# Patient Record
Sex: Male | Born: 2000 | Race: White | Hispanic: No | Marital: Single | Smoking: Never smoker
Health system: Southern US, Community
[De-identification: ages and names within clinical notes are randomized; demographics above are authoritative.]

## PROBLEM LIST (undated history)

## (undated) DIAGNOSIS — J302 Other seasonal allergic rhinitis: Secondary | ICD-10-CM

## (undated) DIAGNOSIS — Z87898 Personal history of other specified conditions: Secondary | ICD-10-CM

## (undated) DIAGNOSIS — R519 Headache, unspecified: Secondary | ICD-10-CM

## (undated) DIAGNOSIS — Z973 Presence of spectacles and contact lenses: Secondary | ICD-10-CM

## (undated) DIAGNOSIS — R51 Headache: Secondary | ICD-10-CM

---
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
## 2019-12-23 ENCOUNTER — Other Ambulatory Visit: Payer: Self-pay

## 2019-12-23 ENCOUNTER — Emergency Department: Payer: BC Managed Care – PPO

## 2019-12-23 DIAGNOSIS — R0789 Other chest pain: Secondary | ICD-10-CM | POA: Diagnosis present

## 2019-12-23 DIAGNOSIS — Z79899 Other long term (current) drug therapy: Secondary | ICD-10-CM | POA: Diagnosis not present

## 2019-12-23 DIAGNOSIS — R1012 Left upper quadrant pain: Secondary | ICD-10-CM | POA: Diagnosis not present

## 2019-12-23 LAB — BASIC METABOLIC PANEL
Anion gap: 7 (ref 5–15)
BUN: 9 mg/dL (ref 6–20)
CO2: 28 mmol/L (ref 22–32)
Calcium: 10.1 mg/dL (ref 8.9–10.3)
Chloride: 106 mmol/L (ref 98–111)
Creatinine, Ser: 1 mg/dL (ref 0.61–1.24)
GFR calc Af Amer: >60 mL/min (ref >60)
GFR calc non Af Amer: >60 mL/min (ref >60)
Glucose, Bld: 102 mg/dL — ABNORMAL HIGH (ref 70–99)
Potassium: 4.4 mmol/L (ref 3.5–5.1)
Sodium: 141 mmol/L (ref 135–145)

## 2019-12-23 LAB — TROPONIN I (HIGH SENSITIVITY): Troponin I (High Sensitivity): 2 ng/L (ref <18)

## 2019-12-23 LAB — CBC
HCT: 43.2 % (ref 39.0–52.0)
Hemoglobin: 15.3 g/dL (ref 13.0–17.0)
MCH: 31 pg (ref 26.0–34.0)
MCHC: 35.4 g/dL (ref 30.0–36.0)
MCV: 87.6 fL (ref 80.0–100.0)
Platelets: 268 10*3/uL (ref 150–400)
RBC: 4.93 MIL/uL (ref 4.22–5.81)
RDW: 11.9 % (ref 11.5–15.5)
WBC: 6.5 10*3/uL (ref 4.0–10.5)
nRBC: 0 % (ref 0.0–0.2)

## 2019-12-23 LAB — LIPASE, BLOOD: Lipase: 22 U/L (ref 11–51)

## 2019-12-23 NOTE — ED Notes (Signed)
Pt walking around lobby in no acute distress.

## 2019-12-23 NOTE — ED Triage Notes (Signed)
Pt complains of left sided chest and upper abd pain for several months worsening over last 2 days. Pt appears in no acute distress. Pt denies vomiting, diarrhea, shob.

## 2019-12-23 NOTE — ED Notes (Signed)
Pt updated on wait.  °

## 2019-12-24 ENCOUNTER — Emergency Department
Admission: EM | Admit: 2019-12-24 | Discharge: 2019-12-24 | Disposition: A | Discharge status: Home / Self Care | Payer: BC Managed Care – PPO | Attending: Emergency Medicine | Admitting: Emergency Medicine

## 2019-12-24 DIAGNOSIS — R0789 Other chest pain: Secondary | ICD-10-CM

## 2019-12-24 DIAGNOSIS — R109 Unspecified abdominal pain: Secondary | ICD-10-CM

## 2019-12-24 NOTE — ED Notes (Signed)
Reviewed discharge instructions, follow-up care, and prescriptions with patient. Patient verbalized understanding of all information reviewed. Patient stable, with no distress noted at this time.    

## 2019-12-24 NOTE — ED Provider Notes (Signed)
[facility] Emergency Department Provider Note ____________________________________________   First MD Initiated Contact with Patient 12/24/19 [phone]     (approximate)  I have reviewed the triage vital signs and the nursing notes.   HISTORY  Chief Complaint Abdominal Pain and Chest Pain    HPI Adrian Montes is a 19 y.o. male with PMH as noted below who presents with episodes of abdominal and chest pain which have been going on for approximately 6 months.  He states that they happen every few days and usually last for about 30 minutes.  The pain is in the left and mid upper abdomen and the left side of the chest.  It is sometimes related to eating, but other times happens without anything preceding it.  He denies any nausea or vomiting and has no shortness of breath.  He has taken famotidine for a once today without any relief.  The patient states that today's episode lasted for about 2 hours which is the reason he came to be evaluated.  Past Medical History:  Diagnosis Date  . H/O motion sickness   . Headache    in past  . Seasonal allergies   . Wears contact lenses     There are no problems to display for this patient.   Past Surgical History:  Procedure Laterality Date  . TEAR DUCT PROBING  2003  . TONSILLECTOMY AND ADENOIDECTOMY N/A 03/06/2015   Procedure: TONSILLECTOMY AND ADENOIDECTOMY;  Surgeon: Carloyn Manner, MD;  Location: [facility];  Service: ENT;  Laterality: N/A;    Prior to Admission medications   Medication Sig Start Date End Date Taking? Authorizing Provider  loratadine (CLARITIN) 10 MG tablet Take 10 mg by mouth daily. AM    [provider]  montelukast (SINGULAIR) 10 MG tablet Take 10 mg by mouth daily. AM    [provider]  Multiple Vitamin (MULTIVITAMIN) capsule Take 1 capsule by mouth daily. AM    [provider]    Allergies Patient has no known allergies.  No family history on  file.  Social History Social History   Tobacco Use  . Smoking status: Never Smoker  Substance Use Topics  . Alcohol use: No  . Drug use: Not on file    Review of Systems  Constitutional: No fever. Eyes: No redness. ENT: No sore throat. Cardiovascular: Positive for resolved chest pain. Respiratory: Denies shortness of breath. Gastrointestinal: No vomiting or diarrhea.  Genitourinary: Negative for flank pain.  Musculoskeletal: Negative for back pain. Skin: Negative for rash. Neurological: Negative for headache.   ____________________________________________   PHYSICAL EXAM:  VITAL SIGNS: ED Triage Vitals [12/23/19 2010]  Enc Vitals Group     BP (!) 152/79     Pulse Rate 79     Resp 14     Temp 98.4 F (36.9 C)     Temp Source Oral     SpO2 100 %     Weight 185 lb (83.9 kg)     Height 6' (1.829 m)     Head Circumference      Peak Flow      Pain Score 5     Pain Loc      Pain Edu?      Excl. in [place]?     Constitutional: Alert and oriented. Well appearing and in no acute distress. Eyes: Conjunctivae are normal.  Head: Atraumatic. Nose: No congestion/rhinnorhea. Mouth/Throat: Mucous membranes are moist.   Neck: Normal range of motion.  Cardiovascular: Normal rate, regular rhythm. Grossly normal heart sounds.  Good peripheral circulation. Respiratory: Normal respiratory effort.  No retractions. Lungs CTAB. Gastrointestinal: Soft and nontender. No distention.  Genitourinary: No flank tenderness. Musculoskeletal: Extremities warm and well perfused.  Neurologic:  Normal speech and language. No gross focal neurologic deficits are appreciated.  Skin:  Skin is warm and dry. No rash noted. Psychiatric: Mood and affect are normal. Speech and behavior are normal.  ____________________________________________   LABS (all labs ordered are listed, but only abnormal results are displayed)  Labs Reviewed  BASIC METABOLIC PANEL - Abnormal; Notable for the following  components:      Result Value   Glucose, Bld 102 (*)    All other components within normal limits  CBC  LIPASE, BLOOD  TROPONIN I (HIGH SENSITIVITY)   ____________________________________________  EKG  ED ECG REPORT I, Dionne Bucy, the attending physician, personally viewed and interpreted this ECG.  Date: 12/24/2019 EKG Time: 2012 Rate: 69 Rhythm: normal sinus rhythm QRS Axis: normal Intervals: normal ST/T Wave abnormalities: normal Narrative Interpretation: no evidence of acute ischemia  ____________________________________________  RADIOLOGY  CXR: No focal infiltrate or other acute abnormality  ____________________________________________   PROCEDURES  Procedure(s) performed: No  Procedures  Critical Care performed: No ____________________________________________   INITIAL IMPRESSION / ASSESSMENT AND PLAN / ED COURSE  Pertinent labs & imaging results that were available during my care of the patient were reviewed by me and considered in my medical decision making (see chart for details).  19 year old male with PMH as noted above presents with intermittent episodes of mainly left-sided chest and abdominal pain for the last 6 months.  The episode today lasted somewhat longer which is what prompted him to come to the hospital.  He has not tried anything for it except for taking famotidine once.  On exam, the patient is overall well-appearing.  His vital signs are normal.  The physical exam is unremarkable.  EKG was reviewed and is nonischemic.  I reviewed the past medical records in Epic.  The patient has no prior ED visits or admissions here.  Given the intermittent episodic nature of the pain and its location, overall I suspect most likely gastritis, PUD, or GERD.  The patient has no ACS risk factors and given the chronic nature of these episodes and the nonexertional pain, I do not suspect cardiac etiology.  There is also no clinical evidence of PE or  vascular etiology.  Lab work-up was obtained from triage and is all within normal limits.  Chest x-ray also shows no acute findings.  Given the duration of the symptoms and the very low risk for ACS there is no indication for repeat troponin.  At this time, the patient is stable for discharge home.  I recommended that he start taking famotidine twice daily consistently with Maalox if needed episodically.  The patient appears frustrated that the symptoms have been ongoing for such a long time.  I gave him a referral to GI to arrange for close follow-up.  I gave him and his mother thorough return precautions, and they expressed understanding and agreement with the plan of care.   ____________________________________________   FINAL CLINICAL IMPRESSION(S) / ED DIAGNOSES  Final diagnoses:  Abdominal pain, unspecified abdominal location  Atypical chest pain      NEW MEDICATIONS STARTED DURING THIS VISIT:  New Prescriptions   No medications on file     Note:  This document was prepared using Dragon voice recognition software and may include  unintentional dictation errors.    Dionne Bucy, MD 12/24/19 [phone]

## 2019-12-24 NOTE — Discharge Instructions (Addendum)
We suspect that your abdominal and chest pain are due to gastritis or inflammation in the stomach.  We recommend that you start taking famotidine 20 mg twice daily for the next few weeks.  You may take Maalox or similar short acting acid relievers as needed for these episodes of pain.  Make an appointment to follow-up with a gastroenterologist.  We have provided referral information for 2 of our local gastroenterology practices.  Return to the ER for new, worsening, or persistent severe pain, difficulty breathing, weakness, vomiting, fever, or any other new or worsening symptoms that concern you.

## 2021-06-16 IMAGING — CR DG CHEST 2V
2 series · 2 of 2 positions shown · non-contrast
Comparison: None.

CLINICAL DATA: Left-sided chest pain

EXAM:
CHEST - 2 VIEW

[chest pa]
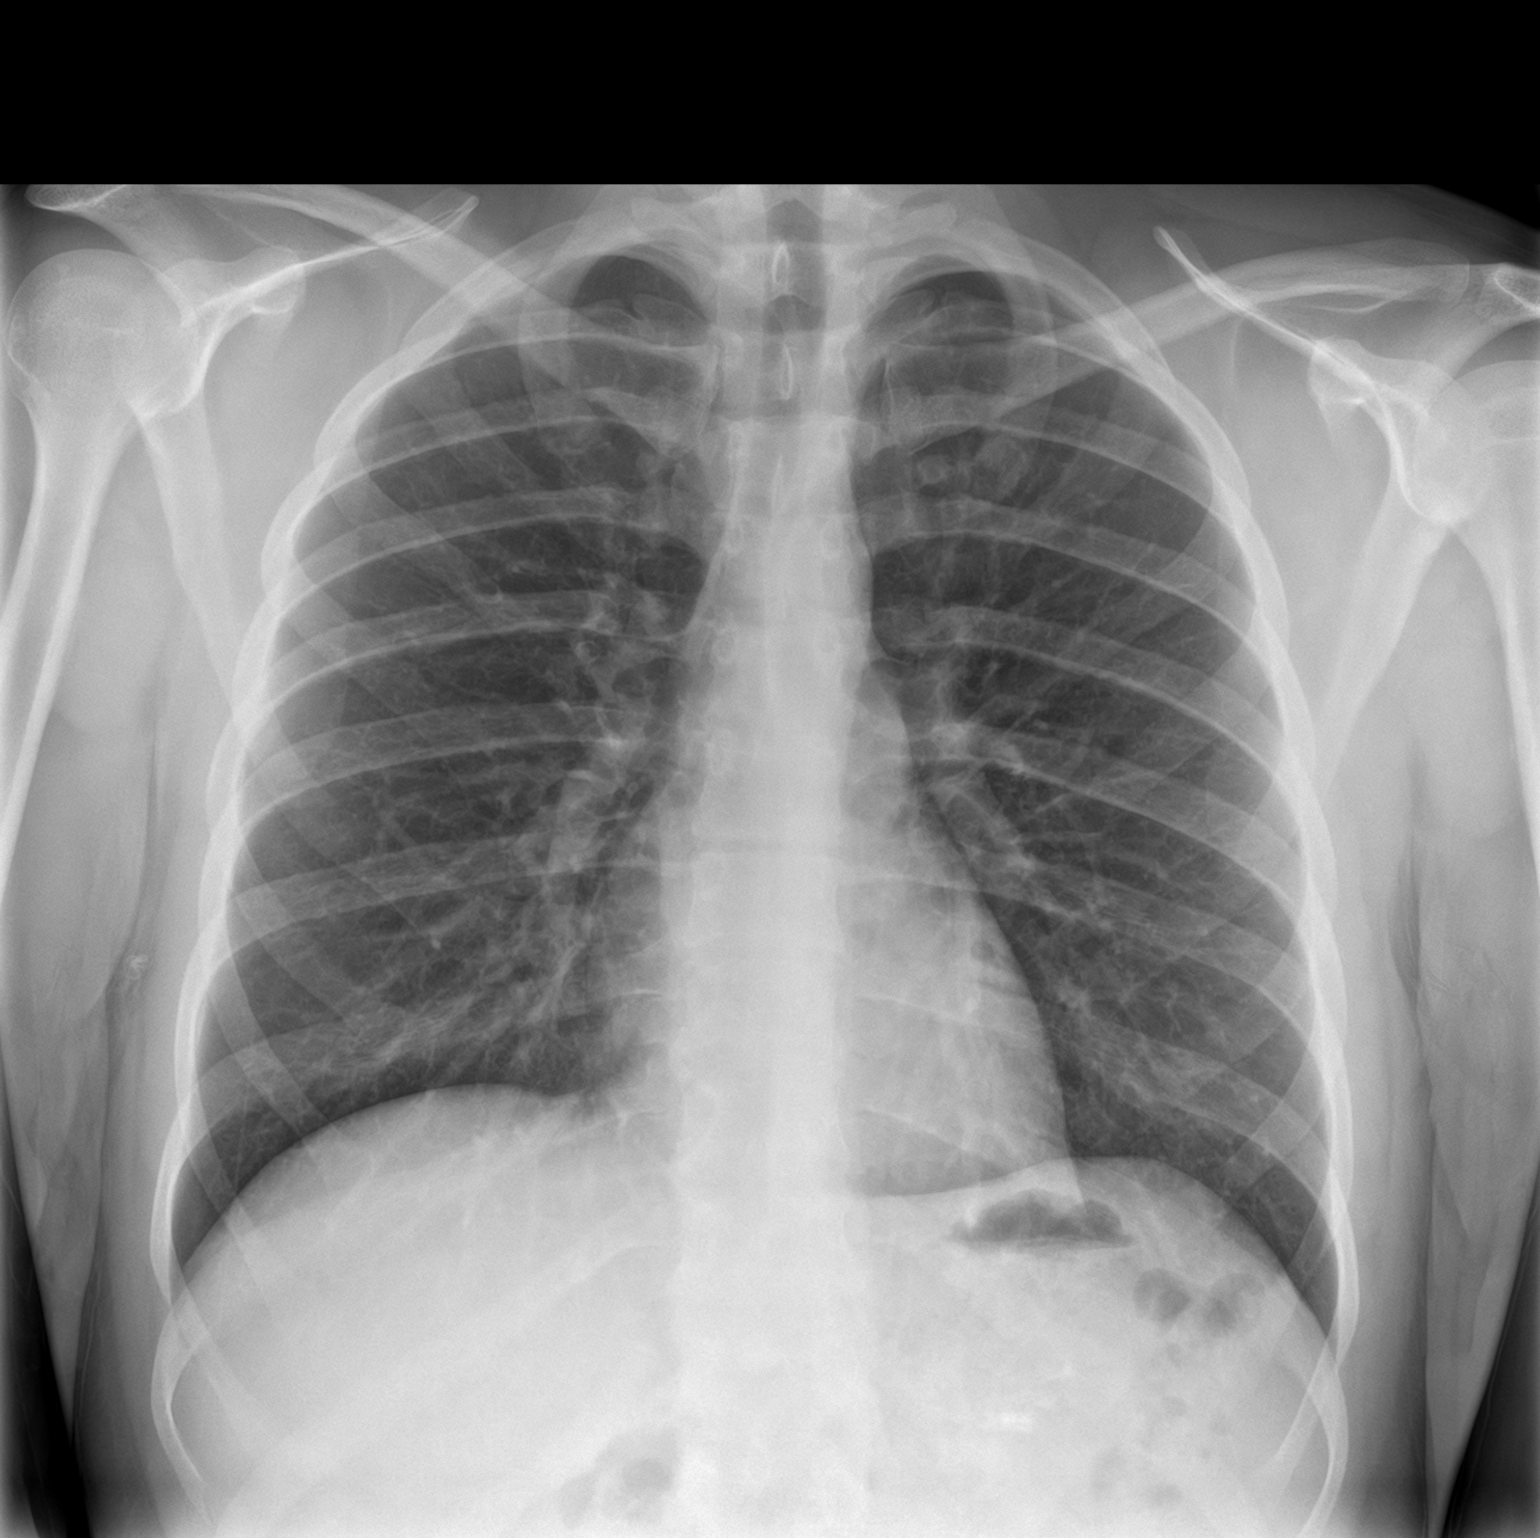

[chest lat]
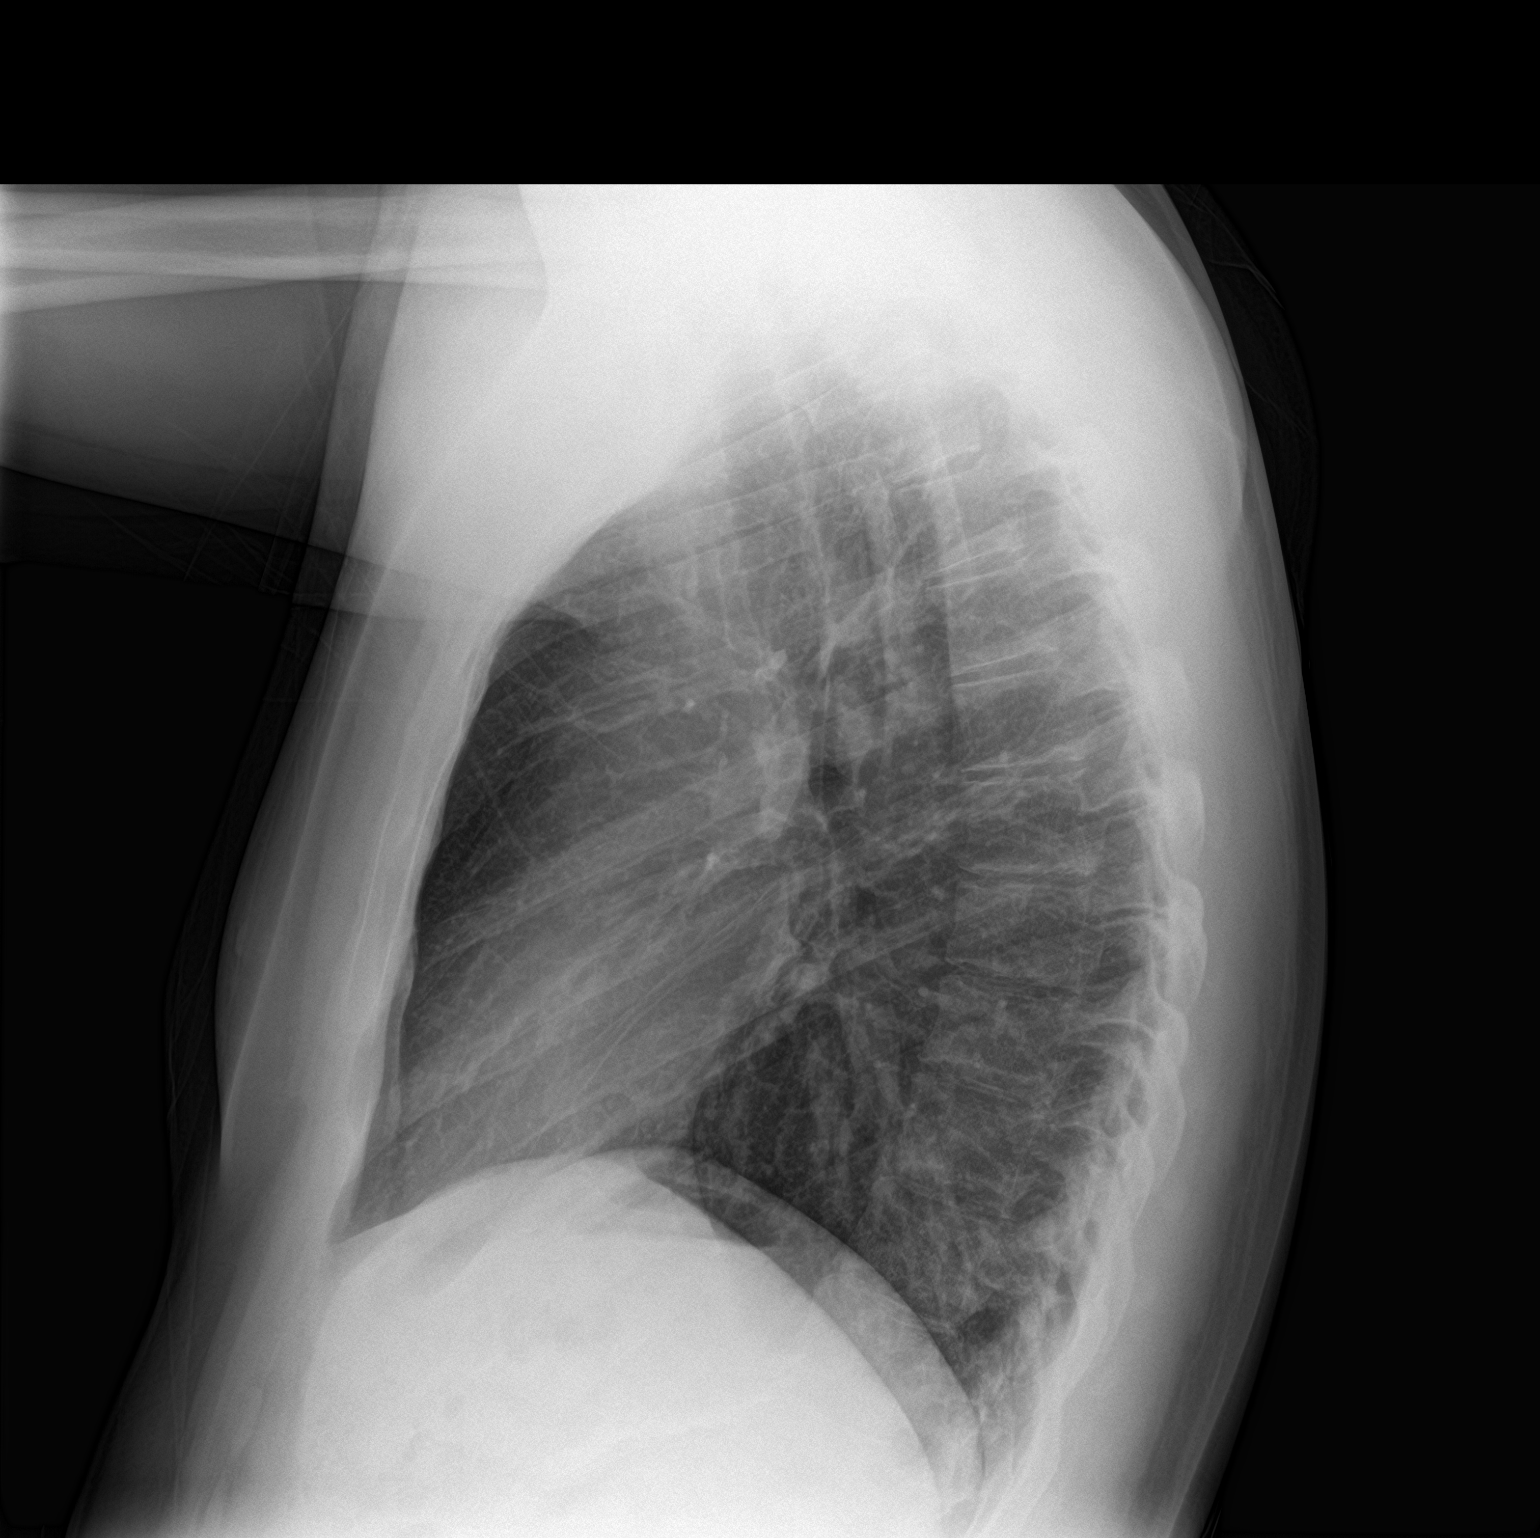

[2 of 2 positions shown; findings below may reference images not displayed]

FINDINGS: The heart size and mediastinal contours are within normal limits.
Both lungs are clear. The visualized skeletal structures are
unremarkable.
IMPRESSION: No acute abnormality of the lungs.

## 2024-02-11 ENCOUNTER — Emergency Department: Admission: EM | Admit: 2024-02-11 | Payer: Self-pay | Source: Home / Self Care
